# Patient Record
Sex: Male | Born: 1955 | Race: White | Hispanic: No | Marital: Married | Smoking: Never smoker
Health system: Southern US, Community
[De-identification: ages and names within clinical notes are randomized; demographics above are authoritative.]

## PROBLEM LIST (undated history)

## (undated) DIAGNOSIS — G4730 Sleep apnea, unspecified: Secondary | ICD-10-CM

## (undated) DIAGNOSIS — I10 Essential (primary) hypertension: Secondary | ICD-10-CM

## (undated) DIAGNOSIS — K219 Gastro-esophageal reflux disease without esophagitis: Secondary | ICD-10-CM

---

## 2002-04-28 ENCOUNTER — Encounter: Payer: Self-pay | Admitting: Neurosurgery

## 2002-05-01 ENCOUNTER — Encounter: Payer: Self-pay | Admitting: Neurosurgery

## 2002-05-01 ENCOUNTER — Ambulatory Visit (HOSPITAL_COMMUNITY): Admission: RE | Admit: 2002-05-01 | Discharge: 2002-05-02 | Payer: Self-pay | Admitting: Neurosurgery

## 2002-05-24 ENCOUNTER — Ambulatory Visit (HOSPITAL_COMMUNITY): Admission: RE | Admit: 2002-05-24 | Discharge: 2002-05-24 | Payer: Self-pay | Admitting: Neurosurgery

## 2002-05-24 ENCOUNTER — Encounter: Payer: Self-pay | Admitting: Neurosurgery

## 2002-08-12 ENCOUNTER — Inpatient Hospital Stay (HOSPITAL_COMMUNITY): Admission: RE | Admit: 2002-08-12 | Discharge: 2002-08-15 | Payer: Self-pay | Admitting: Neurosurgery

## 2002-08-12 ENCOUNTER — Encounter: Payer: Self-pay | Admitting: Neurosurgery

## 2002-09-16 ENCOUNTER — Ambulatory Visit (HOSPITAL_COMMUNITY): Admission: RE | Admit: 2002-09-16 | Discharge: 2002-09-16 | Payer: Self-pay | Admitting: Neurosurgery

## 2002-09-16 ENCOUNTER — Encounter: Payer: Self-pay | Admitting: Neurosurgery

## 2002-10-01 ENCOUNTER — Emergency Department (HOSPITAL_COMMUNITY): Admission: EM | Admit: 2002-10-01 | Discharge: 2002-10-01 | Payer: Self-pay | Admitting: *Deleted

## 2002-10-01 ENCOUNTER — Encounter: Payer: Self-pay | Admitting: Neurosurgery

## 2002-10-01 ENCOUNTER — Encounter: Payer: Self-pay | Admitting: Emergency Medicine

## 2002-10-01 ENCOUNTER — Encounter: Admission: RE | Admit: 2002-10-01 | Discharge: 2002-10-01 | Payer: Self-pay | Admitting: Neurosurgery

## 2005-05-30 ENCOUNTER — Encounter: Payer: Self-pay | Admitting: Neurosurgery

## 2006-09-17 ENCOUNTER — Encounter: Admission: RE | Admit: 2006-09-17 | Discharge: 2006-09-17 | Payer: Self-pay | Admitting: Neurosurgery

## 2006-10-17 ENCOUNTER — Encounter: Admission: RE | Admit: 2006-10-17 | Discharge: 2006-10-17 | Payer: Self-pay | Admitting: Neurosurgery

## 2008-03-05 IMAGING — CR DG PELVIS 1-2V
1 series · 1 of 1 positions shown · non-contrast
Comparison: none

CLINICAL DATA: Posterior right hip pain for a week.  No injury.
 RIGHT HIP ? 2 VIEW:

[t pelvis a.p.]
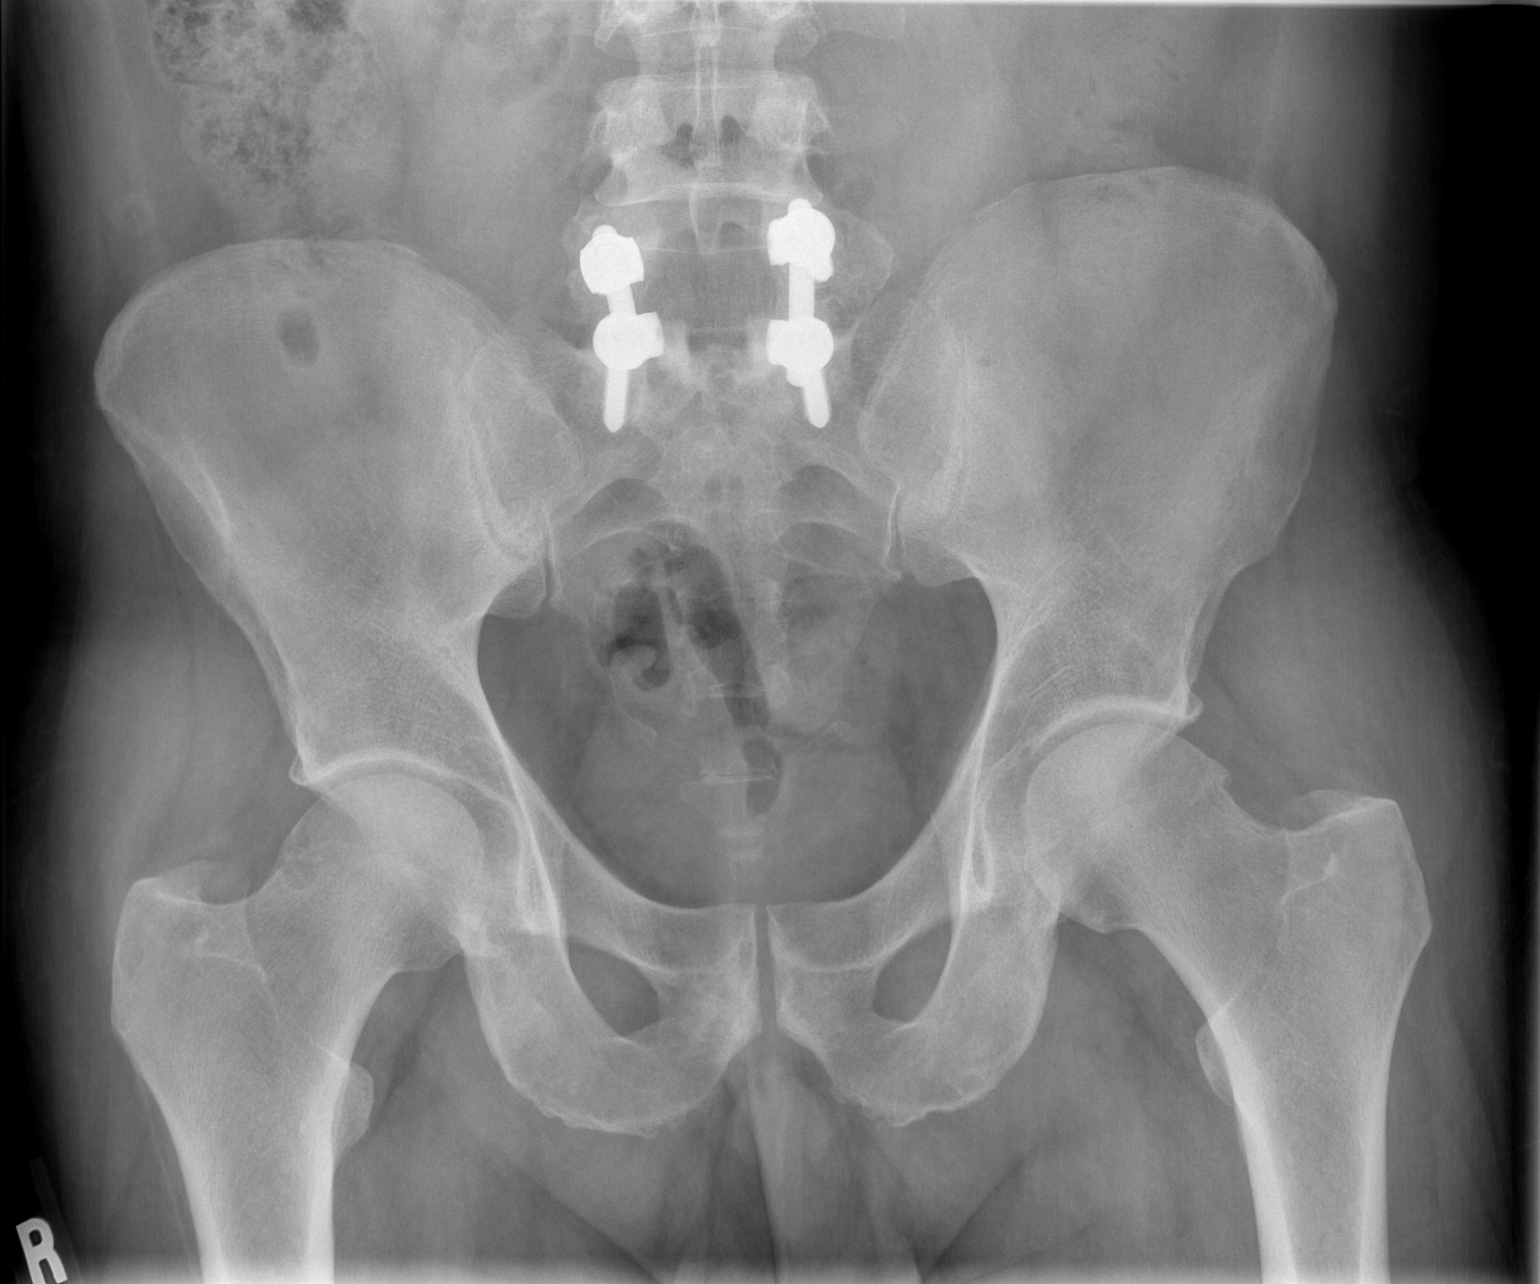

[1 of 1 positions shown; findings below may reference images not displayed]

FINDINGS: Two views right hip were obtained.  No acute fracture is seen.  Benign-appearing bony lesion is noted in the right femoral neck with well corticated margins, of doubtful significance.  Pelvic ramus is intact.
IMPRESSION: No acute abnormality.  Hip joint space is normal.
 PELVIS ? 1 VIEW:
FINDINGS: A single view of the pelvis shows both hip joint spaces to be normal.   The pelvic rami are intact.  The SI joints appear normal.  Posterior fusion is noted at L5-S1.
IMPRESSION: Negative pelvis.  Posterior fusion at L5-S1.

## 2010-06-16 NOTE — Op Note (Signed)
NAMECAIDENCE, HIGASHI NO.:  [id number]   MEDICAL RECORD NO.:  [id number]                   PATIENT TYPE:  OIB   LOCATION:  3011                                 FACILITY:  [facility]   PHYSICIAN:  Donalee Citrin, M.D.                     DATE OF BIRTH:  Jul 06, 1955   DATE OF PROCEDURE:  05/01/2002  DATE OF DISCHARGE:                                 OPERATIVE REPORT   PREOPERATIVE DIAGNOSIS:  Left S1 radiculopathy.   PROCEDURE:  Lumbar laminectomy with microdiskectomy at L5-S1 left, with  microscopic dissection of left S1 nerve root.   SURGEON:  Donalee Citrin, M.D.   ASSISTANT:  Reinaldo Meeker, M.D.   ANESTHESIA:  General endotracheal.   CLINICAL NOTE:  The patient is a very pleasant 55 year old gentleman who has  had longstanding back and leg pain radiating down his left leg  predominantly, to the outside of his foot and his little toe, with numbness  and tingling in the same distribution.  The patient has been refractory to  conservative treatment.  Preoperative imaging showed a large ruptured disk  at L5-S1 compressing the left S1 nerve root as well as the thecal sac.  The  patient did have some right leg symptoms, but the left leg predominated and  the disk was predominantly central and paracentral to the left.  The patient  was explained the risks and benefits of surgery, and he understands and  agreed to proceed forward.   DESCRIPTION OF PROCEDURE:  The patient was brought to the OR, was induced  under general anesthesia and positioned prone on the Wilson frame.  His back  was prepped and draped in the usual sterile fashion.  A preoperative x-ray  localized the S1-2 disk space, so the incision was made slightly superior to  this through the old scar.  Then Bovie electrocautery was used to take down  the subcutaneous tissue and subperiosteal dissection was carried out in the  lamina of L5 and S1 on the left.  Then an intraoperative x-ray confirmed  localization of the L5-S1 disk space.  Then using a 3 and 4 mm Kerrison  punch, the inferior aspect of the lamina of L5 and the superior aspect of  the lamina of S1 was removed from the medial aspect of the facet complex,  exposing the ligamentum flavum.  This was also removed in a piecemeal  fashion, exposing the thecal sac and the S1 and S2 nerve root.  Then the  operating microscope was draped and brought into the field and under  microscopic illumination, the S1 nerve root was dissected off a very large,  partially calcified and degenerated disk rupture and reflected medially with  the D'Errico nerve root retractor, and several large fragments of disk were  removed, teased out with a blunt nerve hook and pituitary rongeurs.  Then  the remainder of the  annulus was opened up with an 11 blade scalpel.  The  disk was cleaned out.  Several partially calcified pieces of disk in several  fragments were removed both centrally as well as cephalocaudally.  At the  end of the diskectomy, there was no further stenosis of the thecal sac or  the S1 nerve root.  The wound was copiously irrigated and meticulous  hemostasis was maintained.  Gelfoam was overlaid on top of the dura.  The  muscle and fascia were reapproximated with 0 interrupted Vicryl, the  subcutaneous tissue with 2-0  interrupted Vicryl, and the skin was closed with running 4-0 subcuticular.  Benzoin and Steri-Strips were applied.  The patient went to the recovery  room in stable condition.  At the end of the case all needle and sponge  counts were correct.                                               Donalee Citrin, M.D.    GC/MEDQ  D:  05/01/2002  T:  05/02/2002  Job:  [id number]

## 2010-06-16 NOTE — Op Note (Signed)
Chris Dodson, Chris Dodson                          ACCOUNT NO.:  [id number]   MEDICAL RECORD NO.:  [id number]                   PATIENT TYPE:  INP   LOCATION:  3172                                 FACILITY:  [facility]   PHYSICIAN:  Donalee Citrin, M.D.                     DATE OF BIRTH:  Dec 24, 1955   DATE OF PROCEDURE:  08/12/2002  DATE OF DISCHARGE:                                 OPERATIVE REPORT   PREOPERATIVE DIAGNOSIS:  Recurrent herniated nucleus pulposus L5-S1 with  left greater than right S1 radiculopathy, severe diskogenic mechanical low  back pain and diskogenic instability.   POSTOPERATIVE DIAGNOSIS:  Recurrent herniated nucleus pulposus L5-S1 with  left greater than right S1 radiculopathy, severe diskogenic mechanical low  back pain and diskogenic instability.   OPERATION PERFORMED:  Redo decompressive laminectomy L5-S1, posterior lumbar  interbody fusion, L5-S1 using 8 x 26 mm tangent allograft wedges, pedicle  screw fixation L5-S1 using the N10 pedicle screw system with 6.5 x 45  pedicle screws at L5 and 6.5 x 40 at S1.  Posterolateral arthrodesis using  locally harvested autograft L5-S1.   SURGEON:  Donalee Citrin, M.D.   ASSISTANT:  Kathaleen Maser. Pool, M.D.   ANESTHESIA:  General endotracheal.   INDICATIONS FOR PROCEDURE:  The patient is a very pleasant 55 year old  gentleman who has had longstanding back and leg pain.  He underwent  laminectomy and microdiskectomy several months ago and did very well for  several weeks postoperative and then experienced immediate recurrence of  severe back and leg pain.  He underwent conservative treatment with physical  therapy, epidural  steroid injections and had some relief of his leg pain;  however, the back pain got progressively worse and worse.  Preoperative  reimaging showed severe degenerative disk disease and collapse at L5-S1 with  end plate changes too, as well as recurrent herniated nucleus pulposus at L5-  S1 on the left side.  The  patient's symptomatology is predominantly back  greater than leg pain with pain going from lying to sitting and sitting to  standing position, worse with all types of mechanical motion and activity.  This was felt to be probably discogenic and patient was recommended  decompression and stabilization procedure.  I extensively went over the  risks and benefits of surgery with him and he understands and agreed to  proceed forward.   DESCRIPTION OF PROCEDURE:  The patient was brought to the operating room was  induced under general anesthesia and placed prone on the Wilson frame. The  back was prepped and draped in the usual sterile fashion.  The old incision  was opened up.  Bovie electrocautery was used to take down to subcutaneous  tissue and subperiosteal dissection was carried out to the lamina of L4, 5  and S1 bilaterally exposing the transverse processes of L5 and S1.  Intraoperative x-ray confirmed localization of the L5  pedicle.  A lot of  scar tissue was teased away from the L5-S1 laminectomy on the left side.  Then using high speed drill, medial aspect of the facet complex bilaterally  was drilled down. Then using Leksell rongeur and 3 and 4 mm Kerrison  punches, a radical decompressive laminectomy and medial facetectomy was  completed on the right side exposing both the right L5 and S1 nerve roots  __________  foramina. The disk space was then identified.  Epidural veins  were coagulated. There was noted to be large bulbous kind of diffuse disk  bulge at this level on the right. Then attention taken to the left side.  Scar tissue was freed up from the dura with a 4 Penfield and  dissectors  exposing the L5 and S1 nerve roots.  Medial facet complex was removed and  the scar tissue was teased away to free up the S1 nerve root from the  pedicle on the left side, then the interspace was identified.  There was a  large bulbous fragment contained in the ligament noted on the left side.   This was teased away and the S1 nerve root was reflected medially. Then  epidural veins coagulated.  Then using the high speed drill, pilot holes  were drilled at the L5 pedicle on the left.  This pedicle was cannulated,  probed, capped and probed again and a 6.5 x 45 pedicle screw was inserted at  L5 on the left.  Fluoroscopy confirmed good depth and trajectory __________  confirmed no medial breach and the pedicle was probed 360 orientation at  each step along the way and noted to be completely competent.  This  procedure was repeated at  S1 on the left and a 6.5 x 40 pedicle screw  inserted and this procedure was repeated on the right side with 6.5 x 45 at  L5 and 6.5 x 35 at S1.  Then D'Errico nerve root retractor was used to  reflect the right S1 nerve root medially. The disk space was incised and  found to be severely collapsed.  Using a size 7 distractor, this disk space  was opened up and on the left side, D'Errico nerve root retractor was used  to reflect the S1 nerve root medially.  The disk space was adequately  cleaned out and a distractor was inserted.  Then on the right side, again,  the disk space was reentered, cleaned out with down going Epstein curets,  pituitary rongeurs and 8 cutter and chisel were used to prepare the end  plates.  Again fluoroscopy confirmed the depth and trajectory at each step  along the way and 8 x 26 mm tangent allograft was inserted on the right  side.  The distractor was removed from the left side.  The disk space was  adequately cleaned out __________  fragments contained in the ligament as  well as centrally were scraped out using size 8 cutter and chisel to loosen  end plates were prepared to receive the bone graft and __________  packed  into the central interspace against the allograft on the right side and 8 x  26 mm Tangent allograft inserted on the left side.  __________  fluoroscopy confirmed the depth and trajectory approximately  __________  posterior  vertebral body line.  Attention was taken to aggressive decortication after  copious irrigation was achieved, then decortication on the transverse  processes and lateral gutters was achieved.  The remainder of the locally  harvested autograft  was packed into the lateral gutters along the transverse  processes and __________  complexes.  Then the rods were inserted and  tightened down at S1. The L5 pedicle screws were compressed again S1 and  torqued down.  Gelfoam was overlaid atop the dura. Fluoroscopy confirmed  good position of rod, screws and bone grafts.  Then the medium Hemovac drain  was placed.  Meticulous hemostasis was maintained.  The muscle and fascia  reapproximated with 0 interrupted Vicryl, subcutaneous tissue closed with 2-  0 interrupted Vicryl and the skin closed with two interrupted Vicryl and the  skin was closed with running 4-0 subcuticular with Benzoin and Steri-Strips  applied.  The patient was then transferred to the recovery room in stable  condition.  At the end of the case all sponge and needle counts were  correct.                                                Donalee Citrin, M.D.    GC/MEDQ  D:  08/12/2002  T:  08/12/2002  Job:  [id number]

## 2010-06-16 NOTE — Discharge Summary (Signed)
   Chris Dodson, Chris Dodson NO.:  [id number]   MEDICAL RECORD NO.:  [id number]                   PATIENT TYPE:  INP   LOCATION:  3030                                 FACILITY:  [facility]   PHYSICIAN:  Donalee Citrin, M.D.                     DATE OF BIRTH:  [birth year]   DATE OF ADMISSION:  08/12/2002  DATE OF DISCHARGE:  08/15/2002                                 DISCHARGE SUMMARY   ADMISSION DIAGNOSIS:  Severe degenerative disk disease at L5-S1 with  ruptured disk and S1 radiculopathy.   PROCEDURE:  The patient underwent a decompressive laminectomy re-do at L5-S1  with posterior interbody fusion.   I discussed the risks of surgery with him and he understands and agreed to  proceed.   HOSPITAL COURSE:  The patient was admitted and went to the operating room  and underwent the aforementioned procedure.  Postoperatively he did very  well recovering on the floor.  He was afebrile with stable vital signs.  He  was ambulating and voiding.  Subsequently, by day two the Hemovac drain had  put out minimal amounts and this was discontinued.  The patient was  progressively mobilized with physical therapy.  He was running a low grade  temperature of 100.  This immediately defervesced and by hospital day three  the patient was stable for discharge home.   MEDICATIONS:  He was discharged home on pain medication and muscle relaxer.   FOLLOW UP:  He is scheduled to follow-up in two weeks.                                                Donalee Citrin, M.D.    GC/MEDQ  D:  09/10/2002  T:  09/10/2002  Job:  [id number]

## 2020-05-12 ENCOUNTER — Other Ambulatory Visit: Payer: Self-pay | Admitting: Neurosurgery

## 2020-05-24 NOTE — Progress Notes (Signed)
Surgical Instructions    Your procedure is scheduled on Friday, April 29th, 2022.  Report to [facility] Main Entrance "A" at 12:40 A.M., then check in with the Admitting office.  Call this number if you have problems the morning of surgery:  [phone]   If you have any questions prior to your surgery date call [phone]: Open Monday-Friday 8am-4pm    Remember:  Do not eat or drink after midnight the night before your surgery   Take these medicines the morning of surgery with A SIP OF WATER   amLODipine (NORVASC)  atorvastatin (LIPITOR) Fenofibrate FLUoxetine (PROZAC) gabapentin (NEURONTIN) HYDROcodone-acetaminophen (NORCO) metoprolol tartrate (LOPRESSOR) pantoprazole (PROTONIX)  SYNTHROID celecoxib (CELEBREX)   If needed  traMADol (ULTRAM)   . Do not take  metFORMIN (GLUCOPHAGE)   the morning of surgery.   HOW TO MANAGE YOUR DIABETES BEFORE AND AFTER SURGERY  Why is it important to control my blood sugar before and after surgery? . Improving blood sugar levels before and after surgery helps healing and can limit problems. . A way of improving blood sugar control is eating a healthy diet by: o  Eating less sugar and carbohydrates o  Increasing activity/exercise o  Talking with your doctor about reaching your blood sugar goals . High blood sugars (greater than 180 mg/dL) can raise your risk of infections and slow your recovery, so you will need to focus on controlling your diabetes during the weeks before surgery. . Make sure that the doctor who takes care of your diabetes knows about your planned surgery including the date and location.  How do I manage my blood sugar before surgery? . Check your blood sugar at least 4 times a day, starting 2 days before surgery, to make sure that the level is not too high or low. . Check your blood sugar the morning of your surgery when you wake up and every 2 hours until you get to the Short Stay unit. o If your blood sugar is  less than 70 mg/dL, you will need to treat for low blood sugar: - Do not take insulin. - Treat a low blood sugar (less than 70 mg/dL) with  cup of clear juice (cranberry or apple), 4 glucose tablets, OR glucose gel. - Recheck blood sugar in 15 minutes after treatment (to make sure it is greater than 70 mg/dL). If your blood sugar is not greater than 70 mg/dL on recheck, call [phone] for further instructions. . Report your blood sugar to the short stay nurse when you get to Short Stay.  . If you are admitted to the hospital after surgery: o Your blood sugar will be checked by the staff and you will probably be given insulin after surgery (instead of oral diabetes medicines) to make sure you have good blood sugar levels. o The goal for blood sugar control after surgery is 80-180 mg/dL.   As of today, STOP taking any Aspirin (unless otherwise instructed by your surgeon) Aleve, Naproxen, Ibuprofen, Motrin, Advil, Goody's, BC's, all herbal medications, fish oil, and all vitamins.                     Do not wear jewelry,             Do not wear lotions, powders, colognes, or deodorant.            Men may shave face and neck.            Do not bring valuables to the  hospital.            [facility] is not responsible for any belongings or valuables.  Do NOT Smoke (Tobacco/Vaping) or drink Alcohol 24 hours prior to your procedure If you use a CPAP at night, you may bring all equipment for your overnight stay.   Contacts, glasses, dentures or bridgework may not be worn into surgery, please bring cases for these belongings   For patients admitted to the hospital, discharge time will be determined by your treatment team.   Patients discharged the day of surgery will not be allowed to drive home, and someone needs to stay with them for 24 hours.    Special instructions:   [place]- Preparing For Surgery  Before surgery, you can play an important role. Because skin is not sterile, your  skin needs to be as free of germs as possible. You can reduce the number of germs on your skin by washing with CHG (chlorahexidine gluconate) Soap before surgery.  CHG is an antiseptic cleaner which kills germs and bonds with the skin to continue killing germs even after washing.    Oral Hygiene is also important to reduce your risk of infection.  Remember - BRUSH YOUR TEETH THE MORNING OF SURGERY WITH YOUR REGULAR TOOTHPASTE  Please do not use if you have an allergy to CHG or antibacterial soaps. If your skin becomes reddened/irritated stop using the CHG.  Do not shave (including legs and underarms) for at least 48 hours prior to first CHG shower. It is OK to shave your face.  Please follow these instructions carefully.   1. Shower the NIGHT BEFORE SURGERY and the MORNING OF SURGERY  2. If you chose to wash your hair, wash your hair first as usual with your normal shampoo.  3. After you shampoo, rinse your hair and body thoroughly to remove the shampoo.  4. Wash Face and genitals (private parts) with your normal soap.   5.  Shower the NIGHT BEFORE SURGERY and the MORNING OF SURGERY with CHG Soap.   6. Use CHG Soap as you would any other liquid soap. You can apply CHG directly to the skin and wash gently with a scrungie or a clean washcloth.   7. Apply the CHG Soap to your body ONLY FROM THE NECK DOWN.  Do not use on open wounds or open sores. Avoid contact with your eyes, ears, mouth and genitals (private parts). Wash Face and genitals (private parts)  with your normal soap.   8. Wash thoroughly, paying special attention to the area where your surgery will be performed.  9. Thoroughly rinse your body with warm water from the neck down.  10. DO NOT shower/wash with your normal soap after using and rinsing off the CHG Soap.  11. Pat yourself dry with a CLEAN TOWEL.  12. Wear CLEAN PAJAMAS to bed the night before surgery  13. Place CLEAN SHEETS on your bed the night before your  surgery  14. DO NOT SLEEP WITH PETS.   Day of Surgery: Take a shower with CHG soap. Wear Clean/Comfortable clothing the morning of surgery Do not apply any deodorants/lotions.   Remember to brush your teeth WITH YOUR REGULAR TOOTHPASTE.   Please read over the following fact sheets that you were given.

## 2020-05-25 ENCOUNTER — Other Ambulatory Visit: Payer: Self-pay

## 2020-05-25 ENCOUNTER — Encounter (HOSPITAL_COMMUNITY): Payer: Self-pay

## 2020-05-25 ENCOUNTER — Encounter (HOSPITAL_COMMUNITY)
Admission: RE | Admit: 2020-05-25 | Discharge: 2020-05-25 | Disposition: A | Discharge status: Home / Self Care | Payer: Medicare (Managed Care) | Source: Ambulatory Visit | Attending: Neurosurgery | Admitting: Neurosurgery

## 2020-05-25 DIAGNOSIS — Z01818 Encounter for other preprocedural examination: Secondary | ICD-10-CM | POA: Diagnosis present

## 2020-05-25 DIAGNOSIS — Z20822 Contact with and (suspected) exposure to covid-19: Secondary | ICD-10-CM | POA: Insufficient documentation

## 2020-05-25 HISTORY — DX: Gastro-esophageal reflux disease without esophagitis: K21.9

## 2020-05-25 HISTORY — DX: Sleep apnea, unspecified: G47.30

## 2020-05-25 HISTORY — DX: Essential (primary) hypertension: I10

## 2020-05-25 LAB — BASIC METABOLIC PANEL
Anion gap: 8 (ref 5–15)
BUN: 10 mg/dL (ref 8–23)
CO2: 31 mmol/L (ref 22–32)
Calcium: 9.3 mg/dL (ref 8.9–10.3)
Chloride: 103 mmol/L (ref 98–111)
Creatinine, Ser: 1.09 mg/dL (ref 0.61–1.24)
GFR, Estimated: >60 mL/min (ref >60)
Glucose, Bld: 87 mg/dL (ref 70–99)
Potassium: 4.1 mmol/L (ref 3.5–5.1)
Sodium: 142 mmol/L (ref 135–145)

## 2020-05-25 LAB — TYPE AND SCREEN
ABO/RH(D): A POS
Antibody Screen: NEGATIVE

## 2020-05-25 LAB — SURGICAL PCR SCREEN
MRSA, PCR: NEGATIVE
Staphylococcus aureus: NEGATIVE

## 2020-05-25 LAB — GLUCOSE, CAPILLARY: Glucose-Capillary: 107 mg/dL — ABNORMAL HIGH (ref 70–99)

## 2020-05-25 NOTE — Progress Notes (Signed)
PCP - Carola Frost, NP; Hortense Ramal, MD Cardiologist - Denies  PPM/ICD - Denies  Chest x-ray - N/A EKG - 05/25/20 Stress Test - Denies ECHO - Denies Cardiac Cath - Denies  Sleep Study - Yes, positive for OSA, pt does not wear CPAP  Fasting Blood Sugar: Pt does not check CBGs. BCG at PAT appointment was 107. A1C obtained.  Blood Thinner Instructions: N/A Aspirin Instructions: N/A  ERAS Protcol - N/A PRE-SURGERY Ensure or G2- N/A  COVID TEST- 05/25/20   Anesthesia review: No  Patient denies shortness of breath, fever, cough and chest pain at PAT appointment   All instructions explained to the patient, with a verbal understanding of the material. Patient agrees to go over the instructions while at home for a better understanding. Patient also instructed to self quarantine after being tested for COVID-19. The opportunity to ask questions was provided.

## 2020-05-25 NOTE — Progress Notes (Addendum)
Surgical Instructions    Your procedure is scheduled on Friday, April 29th, 2022.  Report to [facility] Main Entrance "A" at 12:40 A.M., then check in with the Admitting office.  Call this number if you have problems the morning of surgery:  [phone]   If you have any questions prior to your surgery date call [phone]: Open Monday-Friday 8am-4pm    Remember:  Do not eat or drink after midnight the night before your surgery   Take these medicines the morning of surgery with A SIP OF WATER   amLODipine (NORVASC)  atorvastatin (LIPITOR) Fenofibrate FLUoxetine (PROZAC) gabapentin (NEURONTIN) HYDROcodone-acetaminophen (NORCO) metoprolol tartrate (LOPRESSOR) pantoprazole (PROTONIX)  SYNTHROID  If needed  traMADol (ULTRAM)   . Do not take  metFORMIN (GLUCOPHAGE)   the morning of surgery.   HOW TO MANAGE YOUR DIABETES BEFORE AND AFTER SURGERY  Why is it important to control my blood sugar before and after surgery? . Improving blood sugar levels before and after surgery helps healing and can limit problems. . A way of improving blood sugar control is eating a healthy diet by: o  Eating less sugar and carbohydrates o  Increasing activity/exercise o  Talking with your doctor about reaching your blood sugar goals . High blood sugars (greater than 180 mg/dL) can raise your risk of infections and slow your recovery, so you will need to focus on controlling your diabetes during the weeks before surgery. . Make sure that the doctor who takes care of your diabetes knows about your planned surgery including the date and location.  How do I manage my blood sugar before surgery? . Check your blood sugar at least 4 times a day, starting 2 days before surgery, to make sure that the level is not too high or low. . Check your blood sugar the morning of your surgery when you wake up and every 2 hours until you get to the Short Stay unit. o If your blood sugar is less than 70 mg/dL,  you will need to treat for low blood sugar: - Do not take insulin. - Treat a low blood sugar (less than 70 mg/dL) with  cup of clear juice (cranberry or apple), 4 glucose tablets, OR glucose gel. - Recheck blood sugar in 15 minutes after treatment (to make sure it is greater than 70 mg/dL). If your blood sugar is not greater than 70 mg/dL on recheck, call [phone] for further instructions. . Report your blood sugar to the short stay nurse when you get to Short Stay.  . If you are admitted to the hospital after surgery: o Your blood sugar will be checked by the staff and you will probably be given insulin after surgery (instead of oral diabetes medicines) to make sure you have good blood sugar levels. o The goal for blood sugar control after surgery is 80-180 mg/dL.   As of today, STOP taking any Aspirin (unless otherwise instructed by your surgeon) Aleve, Naproxen, Ibuprofen, Motrin, Advil, Goody's, BC's, all herbal medications, fish oil, and all vitamins.                     Do not wear jewelry,             Do not wear lotions, powders, colognes, or deodorant.            Men may shave face and neck.            Do not bring valuables to the hospital.  [facility] is not responsible for any belongings or valuables.  Do NOT Smoke (Tobacco/Vaping) or drink Alcohol 24 hours prior to your procedure If you use a CPAP at night, you may bring all equipment for your overnight stay.   Contacts, glasses, dentures or bridgework may not be worn into surgery, please bring cases for these belongings   For patients admitted to the hospital, discharge time will be determined by your treatment team.   Patients discharged the day of surgery will not be allowed to drive home, and someone needs to stay with them for 24 hours.    Special instructions:   [facility]- Preparing For Surgery  Before surgery, you can play an important role. Because skin is not sterile, your skin needs to be as  free of germs as possible. You can reduce the number of germs on your skin by washing with CHG (chlorahexidine gluconate) Soap before surgery.  CHG is an antiseptic cleaner which kills germs and bonds with the skin to continue killing germs even after washing.    Oral Hygiene is also important to reduce your risk of infection.  Remember - BRUSH YOUR TEETH THE MORNING OF SURGERY WITH YOUR REGULAR TOOTHPASTE  Please do not use if you have an allergy to CHG or antibacterial soaps. If your skin becomes reddened/irritated stop using the CHG.  Do not shave (including legs and underarms) for at least 48 hours prior to first CHG shower. It is OK to shave your face.  Please follow these instructions carefully.   1. Shower the NIGHT BEFORE SURGERY and the MORNING OF SURGERY  2. If you chose to wash your hair, wash your hair first as usual with your normal shampoo.  3. After you shampoo, rinse your hair and body thoroughly to remove the shampoo.  4. Wash Face and genitals (private parts) with your normal soap.   5.  Shower the NIGHT BEFORE SURGERY and the MORNING OF SURGERY with CHG Soap.   6. Use CHG Soap as you would any other liquid soap. You can apply CHG directly to the skin and wash gently with a scrungie or a clean washcloth.   7. Apply the CHG Soap to your body ONLY FROM THE NECK DOWN.  Do not use on open wounds or open sores. Avoid contact with your eyes, ears, mouth and genitals (private parts). Wash Face and genitals (private parts)  with your normal soap.   8. Wash thoroughly, paying special attention to the area where your surgery will be performed.  9. Thoroughly rinse your body with warm water from the neck down.  10. DO NOT shower/wash with your normal soap after using and rinsing off the CHG Soap.  11. Pat yourself dry with a CLEAN TOWEL.  12. Wear CLEAN PAJAMAS to bed the night before surgery  13. Place CLEAN SHEETS on your bed the night before your surgery  14. DO NOT SLEEP  WITH PETS.   Day of Surgery: Take a shower with CHG soap. Wear Clean/Comfortable clothing the morning of surgery Do not apply any deodorants/lotions.   Remember to brush your teeth WITH YOUR REGULAR TOOTHPASTE.   Please read over the following fact sheets that you were given.

## 2020-05-25 NOTE — Progress Notes (Signed)
Lab called stating there was an error in the lab with the purple top tube. Will need to redraw CBC and HgbA1C on DOS. Orders in signed and held.

## 2020-05-26 LAB — SARS CORONAVIRUS 2 (TAT 6-24 HRS): SARS Coronavirus 2: NEGATIVE

## 2020-05-27 ENCOUNTER — Ambulatory Visit (HOSPITAL_COMMUNITY): Payer: Medicare (Managed Care)

## 2020-05-27 ENCOUNTER — Other Ambulatory Visit: Payer: Self-pay

## 2020-05-27 ENCOUNTER — Encounter (HOSPITAL_COMMUNITY)
Admission: RE | Disposition: A | Discharge status: Home / Self Care | Payer: Self-pay | Source: Ambulatory Visit | Attending: Neurosurgery

## 2020-05-27 ENCOUNTER — Ambulatory Visit (HOSPITAL_COMMUNITY)
Admission: RE | Admit: 2020-05-27 | Discharge: 2020-05-28 | Disposition: A | Discharge status: Home / Self Care | Payer: Medicare (Managed Care) | Source: Ambulatory Visit | Attending: Neurosurgery | Admitting: Neurosurgery

## 2020-05-27 ENCOUNTER — Ambulatory Visit (HOSPITAL_COMMUNITY): Payer: Medicare (Managed Care) | Admitting: Certified Registered Nurse Anesthetist

## 2020-05-27 ENCOUNTER — Encounter (HOSPITAL_COMMUNITY): Payer: Self-pay | Admitting: Neurosurgery

## 2020-05-27 DIAGNOSIS — Z791 Long term (current) use of non-steroidal anti-inflammatories (NSAID): Secondary | ICD-10-CM | POA: Diagnosis not present

## 2020-05-27 DIAGNOSIS — M48061 Spinal stenosis, lumbar region without neurogenic claudication: Secondary | ICD-10-CM | POA: Insufficient documentation

## 2020-05-27 DIAGNOSIS — M5116 Intervertebral disc disorders with radiculopathy, lumbar region: Secondary | ICD-10-CM | POA: Insufficient documentation

## 2020-05-27 DIAGNOSIS — Z7984 Long term (current) use of oral hypoglycemic drugs: Secondary | ICD-10-CM | POA: Diagnosis not present

## 2020-05-27 DIAGNOSIS — Z981 Arthrodesis status: Secondary | ICD-10-CM | POA: Diagnosis not present

## 2020-05-27 DIAGNOSIS — Z79899 Other long term (current) drug therapy: Secondary | ICD-10-CM | POA: Diagnosis not present

## 2020-05-27 DIAGNOSIS — Z79890 Hormone replacement therapy (postmenopausal): Secondary | ICD-10-CM | POA: Diagnosis not present

## 2020-05-27 DIAGNOSIS — Z419 Encounter for procedure for purposes other than remedying health state, unspecified: Secondary | ICD-10-CM

## 2020-05-27 LAB — CBC
HCT: 40.6 % (ref 39.0–52.0)
Hemoglobin: 14.5 g/dL (ref 13.0–17.0)
MCH: 31.3 pg (ref 26.0–34.0)
MCHC: 35.7 g/dL (ref 30.0–36.0)
MCV: 87.7 fL (ref 80.0–100.0)
Platelets: 187 10*3/uL (ref 150–400)
RBC: 4.63 MIL/uL (ref 4.22–5.81)
RDW: 13 % (ref 11.5–15.5)
WBC: 7.2 10*3/uL (ref 4.0–10.5)
nRBC: 0 % (ref 0.0–0.2)

## 2020-05-27 LAB — ABO/RH: ABO/RH(D): A POS

## 2020-05-27 LAB — GLUCOSE, CAPILLARY
Glucose-Capillary: 113 mg/dL — ABNORMAL HIGH (ref 70–99)
Glucose-Capillary: 121 mg/dL — ABNORMAL HIGH (ref 70–99)

## 2020-05-27 SURGERY — POSTERIOR LUMBAR FUSION 1 LEVEL
Anesthesia: General | Site: Back

## 2020-05-27 MED ORDER — GLYCOPYRROLATE PF 0.2 MG/ML IJ SOSY
PREFILLED_SYRINGE | INTRAMUSCULAR | Status: AC
  Filled 2020-05-27: qty 1

## 2020-05-27 MED ORDER — ATORVASTATIN CALCIUM 10 MG PO TABS
20.0000 mg | ORAL_TABLET | Freq: Every day | ORAL | Status: DC
  Administered 2020-05-28: 20 mg via ORAL
  Filled 2020-05-27: qty 2

## 2020-05-27 MED ORDER — TRAZODONE HCL 50 MG PO TABS
50.0000 mg | ORAL_TABLET | Freq: Every day | ORAL | Status: DC
  Administered 2020-05-27: 50 mg via ORAL
  Filled 2020-05-27: qty 1

## 2020-05-27 MED ORDER — OXYCODONE HCL 5 MG PO TABS
10.0000 mg | ORAL_TABLET | ORAL | Status: DC | PRN
Start: 2020-05-27 — End: 2020-05-28
  Administered 2020-05-27 – 2020-05-28 (×4): 10 mg via ORAL
  Filled 2020-05-27 (×3): qty 2

## 2020-05-27 MED ORDER — DEXAMETHASONE SODIUM PHOSPHATE 10 MG/ML IJ SOLN
10.0000 mg | Freq: Once | INTRAMUSCULAR | Status: AC
  Administered 2020-05-27: 10 mg via INTRAVENOUS
  Filled 2020-05-27: qty 1

## 2020-05-27 MED ORDER — THROMBIN 20000 UNITS EX SOLR
CUTANEOUS | Status: DC | PRN
  Administered 2020-05-27: 20 mL via TOPICAL

## 2020-05-27 MED ORDER — CEFAZOLIN SODIUM-DEXTROSE 2-4 GM/100ML-% IV SOLN
2.0000 g | Freq: Three times a day (TID) | INTRAVENOUS | Status: DC
  Administered 2020-05-27 – 2020-05-28 (×2): 2 g via INTRAVENOUS
  Filled 2020-05-27 (×2): qty 100

## 2020-05-27 MED ORDER — ONDANSETRON HCL 4 MG/2ML IJ SOLN
4.0000 mg | Freq: Once | INTRAMUSCULAR | Status: DC | PRN
Start: 2020-05-27 — End: 2020-05-27

## 2020-05-27 MED ORDER — CELECOXIB 100 MG PO CAPS
100.0000 mg | ORAL_CAPSULE | Freq: Two times a day (BID) | ORAL | Status: DC
  Administered 2020-05-27 – 2020-05-28 (×2): 100 mg via ORAL
  Filled 2020-05-27 (×2): qty 1

## 2020-05-27 MED ORDER — THROMBIN 20000 UNITS EX SOLR
CUTANEOUS | Status: AC
  Filled 2020-05-27: qty 20000

## 2020-05-27 MED ORDER — PANTOPRAZOLE SODIUM 40 MG PO TBEC: 40.0000 mg | DELAYED_RELEASE_TABLET | Freq: Every day | ORAL | Status: DC

## 2020-05-27 MED ORDER — DEXAMETHASONE SODIUM PHOSPHATE 10 MG/ML IJ SOLN
INTRAMUSCULAR | Status: AC
  Filled 2020-05-27: qty 1

## 2020-05-27 MED ORDER — PANTOPRAZOLE SODIUM 40 MG PO TBEC
40.0000 mg | DELAYED_RELEASE_TABLET | Freq: Every day | ORAL | Status: DC
  Administered 2020-05-27: 40 mg via ORAL
  Filled 2020-05-27: qty 1

## 2020-05-27 MED ORDER — CYCLOBENZAPRINE HCL 10 MG PO TABS
ORAL_TABLET | ORAL | Status: AC
  Filled 2020-05-27: qty 1

## 2020-05-27 MED ORDER — ACETAMINOPHEN 325 MG PO TABS
650.0000 mg | ORAL_TABLET | ORAL | Status: DC | PRN
  Administered 2020-05-27: 650 mg via ORAL
  Filled 2020-05-27 (×2): qty 2

## 2020-05-27 MED ORDER — HYDROMORPHONE HCL 1 MG/ML IJ SOLN
INTRAMUSCULAR | Status: AC
  Administered 2020-05-27: 0.5 mg via INTRAVENOUS
  Filled 2020-05-27: qty 1

## 2020-05-27 MED ORDER — ALBUMIN HUMAN 5 % IV SOLN: INTRAVENOUS | Status: DC | PRN

## 2020-05-27 MED ORDER — ROCURONIUM BROMIDE 10 MG/ML (PF) SYRINGE
PREFILLED_SYRINGE | INTRAVENOUS | Status: DC | PRN
  Administered 2020-05-27: 20 mg via INTRAVENOUS
  Administered 2020-05-27: 70 mg via INTRAVENOUS

## 2020-05-27 MED ORDER — PROPOFOL 10 MG/ML IV BOLUS
INTRAVENOUS | Status: DC | PRN
  Administered 2020-05-27: 200 mg via INTRAVENOUS

## 2020-05-27 MED ORDER — FENTANYL CITRATE (PF) 100 MCG/2ML IJ SOLN
INTRAMUSCULAR | Status: DC | PRN
  Administered 2020-05-27: 25 ug via INTRAVENOUS
  Administered 2020-05-27: 50 ug via INTRAVENOUS
  Administered 2020-05-27: 100 ug via INTRAVENOUS
  Administered 2020-05-27: 25 ug via INTRAVENOUS
  Administered 2020-05-27: 50 ug via INTRAVENOUS

## 2020-05-27 MED ORDER — FENTANYL CITRATE (PF) 250 MCG/5ML IJ SOLN
INTRAMUSCULAR | Status: AC
  Filled 2020-05-27: qty 5

## 2020-05-27 MED ORDER — FENOFIBRATE 160 MG PO TABS
160.0000 mg | ORAL_TABLET | Freq: Every day | ORAL | Status: DC
  Administered 2020-05-28: 160 mg via ORAL
  Filled 2020-05-27: qty 1

## 2020-05-27 MED ORDER — SODIUM CHLORIDE 0.9% FLUSH: 3.0000 mL | Freq: Two times a day (BID) | INTRAVENOUS | Status: DC

## 2020-05-27 MED ORDER — ACETAMINOPHEN 10 MG/ML IV SOLN: 1000.0000 mg | Freq: Once | INTRAVENOUS | Status: DC | PRN

## 2020-05-27 MED ORDER — ONDANSETRON HCL 4 MG/2ML IJ SOLN
INTRAMUSCULAR | Status: AC
  Filled 2020-05-27: qty 2

## 2020-05-27 MED ORDER — TAMSULOSIN HCL 0.4 MG PO CAPS
0.4000 mg | ORAL_CAPSULE | Freq: Every day | ORAL | Status: DC
  Administered 2020-05-28: 0.4 mg via ORAL
  Filled 2020-05-27: qty 1

## 2020-05-27 MED ORDER — MENTHOL 3 MG MT LOZG: 1.0000 | LOZENGE | OROMUCOSAL | Status: DC | PRN

## 2020-05-27 MED ORDER — CHLORHEXIDINE GLUCONATE 0.12 % MT SOLN
15.0000 mL | Freq: Once | OROMUCOSAL | Status: AC
  Administered 2020-05-27: 15 mL via OROMUCOSAL
  Filled 2020-05-27: qty 15

## 2020-05-27 MED ORDER — ONDANSETRON HCL 4 MG PO TABS: 4.0000 mg | ORAL_TABLET | Freq: Four times a day (QID) | ORAL | Status: DC | PRN

## 2020-05-27 MED ORDER — PROPOFOL 10 MG/ML IV BOLUS
INTRAVENOUS | Status: AC
  Filled 2020-05-27: qty 20

## 2020-05-27 MED ORDER — ONDANSETRON HCL 4 MG/2ML IJ SOLN: 4.0000 mg | Freq: Four times a day (QID) | INTRAMUSCULAR | Status: DC | PRN

## 2020-05-27 MED ORDER — CYCLOBENZAPRINE HCL 10 MG PO TABS
10.0000 mg | ORAL_TABLET | Freq: Three times a day (TID) | ORAL | Status: DC | PRN
  Administered 2020-05-27 – 2020-05-28 (×2): 10 mg via ORAL
  Filled 2020-05-27: qty 1

## 2020-05-27 MED ORDER — ORAL CARE MOUTH RINSE: 15.0000 mL | Freq: Once | OROMUCOSAL | Status: AC

## 2020-05-27 MED ORDER — CHLORHEXIDINE GLUCONATE CLOTH 2 % EX PADS: 6.0000 | MEDICATED_PAD | Freq: Once | CUTANEOUS | Status: DC

## 2020-05-27 MED ORDER — TAMSULOSIN HCL 0.4 MG PO CAPS
0.8000 mg | ORAL_CAPSULE | Freq: Once | ORAL | Status: AC
  Administered 2020-05-27: 0.8 mg via ORAL
  Filled 2020-05-27: qty 2

## 2020-05-27 MED ORDER — SODIUM CHLORIDE 0.9% FLUSH: 3.0000 mL | INTRAVENOUS | Status: DC | PRN

## 2020-05-27 MED ORDER — OXYCODONE HCL 5 MG PO TABS
ORAL_TABLET | ORAL | Status: AC
  Filled 2020-05-27: qty 2

## 2020-05-27 MED ORDER — THROMBIN 5000 UNITS EX SOLR
CUTANEOUS | Status: AC
  Filled 2020-05-27: qty 5000

## 2020-05-27 MED ORDER — GLYCOPYRROLATE 0.2 MG/ML IJ SOLN
INTRAMUSCULAR | Status: DC | PRN
  Administered 2020-05-27 (×2): .1 mg via INTRAVENOUS

## 2020-05-27 MED ORDER — EPHEDRINE SULFATE 50 MG/ML IJ SOLN
INTRAMUSCULAR | Status: DC | PRN
  Administered 2020-05-27 (×4): 5 mg via INTRAVENOUS

## 2020-05-27 MED ORDER — ADULT MULTIVITAMIN W/MINERALS CH
1.0000 | ORAL_TABLET | Freq: Every day | ORAL | Status: DC
  Administered 2020-05-28: 1 via ORAL
  Filled 2020-05-27: qty 1

## 2020-05-27 MED ORDER — PANTOPRAZOLE SODIUM 40 MG IV SOLR: 40.0000 mg | Freq: Every day | INTRAVENOUS | Status: DC

## 2020-05-27 MED ORDER — METFORMIN HCL 500 MG PO TABS
500.0000 mg | ORAL_TABLET | Freq: Two times a day (BID) | ORAL | Status: DC
  Administered 2020-05-28: 500 mg via ORAL
  Filled 2020-05-27: qty 1

## 2020-05-27 MED ORDER — MIDAZOLAM HCL 5 MG/5ML IJ SOLN
INTRAMUSCULAR | Status: DC | PRN
  Administered 2020-05-27: 2 mg via INTRAVENOUS

## 2020-05-27 MED ORDER — HYDROMORPHONE HCL 1 MG/ML IJ SOLN
0.5000 mg | INTRAMUSCULAR | Status: DC | PRN
Start: 2020-05-27 — End: 2020-05-28

## 2020-05-27 MED ORDER — SODIUM CHLORIDE 0.9 % IV SOLN: 250.0000 mL | INTRAVENOUS | Status: DC

## 2020-05-27 MED ORDER — FLUOXETINE HCL 20 MG PO CAPS
40.0000 mg | ORAL_CAPSULE | Freq: Every day | ORAL | Status: DC
  Administered 2020-05-28: 40 mg via ORAL
  Filled 2020-05-27: qty 2

## 2020-05-27 MED ORDER — LIDOCAINE 2% (20 MG/ML) 5 ML SYRINGE
INTRAMUSCULAR | Status: DC | PRN
  Administered 2020-05-27: 100 mg via INTRAVENOUS

## 2020-05-27 MED ORDER — MIDAZOLAM HCL 2 MG/2ML IJ SOLN
INTRAMUSCULAR | Status: AC
  Filled 2020-05-27: qty 2

## 2020-05-27 MED ORDER — SUGAMMADEX SODIUM 200 MG/2ML IV SOLN
INTRAVENOUS | Status: DC | PRN
  Administered 2020-05-27: 200 mg via INTRAVENOUS

## 2020-05-27 MED ORDER — LACTATED RINGERS IV SOLN: INTRAVENOUS | Status: DC

## 2020-05-27 MED ORDER — LIDOCAINE 2% (20 MG/ML) 5 ML SYRINGE
INTRAMUSCULAR | Status: AC
  Filled 2020-05-27: qty 5

## 2020-05-27 MED ORDER — 0.9 % SODIUM CHLORIDE (POUR BTL) OPTIME
TOPICAL | Status: DC | PRN
  Administered 2020-05-27: 1000 mL

## 2020-05-27 MED ORDER — GABAPENTIN 300 MG PO CAPS
300.0000 mg | ORAL_CAPSULE | Freq: Three times a day (TID) | ORAL | Status: DC
  Administered 2020-05-27 – 2020-05-28 (×2): 300 mg via ORAL
  Filled 2020-05-27 (×2): qty 1

## 2020-05-27 MED ORDER — LOSARTAN POTASSIUM 50 MG PO TABS
50.0000 mg | ORAL_TABLET | Freq: Every day | ORAL | Status: DC
  Administered 2020-05-28: 50 mg via ORAL
  Filled 2020-05-27: qty 1

## 2020-05-27 MED ORDER — PHENYLEPHRINE HCL (PRESSORS) 10 MG/ML IV SOLN
INTRAVENOUS | Status: DC | PRN
  Administered 2020-05-27 (×2): 40 ug via INTRAVENOUS

## 2020-05-27 MED ORDER — BUPIVACAINE LIPOSOME 1.3 % IJ SUSP
INTRAMUSCULAR | Status: DC | PRN
  Administered 2020-05-27: 20 mL

## 2020-05-27 MED ORDER — LIDOCAINE-EPINEPHRINE 1 %-1:100000 IJ SOLN
INTRAMUSCULAR | Status: AC
  Filled 2020-05-27: qty 1

## 2020-05-27 MED ORDER — METOPROLOL TARTRATE 25 MG PO TABS
50.0000 mg | ORAL_TABLET | Freq: Two times a day (BID) | ORAL | Status: DC
  Administered 2020-05-27 – 2020-05-28 (×2): 50 mg via ORAL
  Filled 2020-05-27 (×2): qty 2

## 2020-05-27 MED ORDER — HYDROCODONE-ACETAMINOPHEN 10-325 MG PO TABS
1.0000 | ORAL_TABLET | Freq: Four times a day (QID) | ORAL | Status: DC
  Administered 2020-05-27 – 2020-05-28 (×2): 1 via ORAL
  Filled 2020-05-27 (×2): qty 1

## 2020-05-27 MED ORDER — LIDOCAINE-EPINEPHRINE 1 %-1:100000 IJ SOLN
INTRAMUSCULAR | Status: DC | PRN
  Administered 2020-05-27: 10 mL

## 2020-05-27 MED ORDER — CEFAZOLIN SODIUM-DEXTROSE 2-4 GM/100ML-% IV SOLN
2.0000 g | INTRAVENOUS | Status: AC
  Administered 2020-05-27: 2 g via INTRAVENOUS
  Filled 2020-05-27: qty 100

## 2020-05-27 MED ORDER — TRAMADOL HCL 50 MG PO TABS: 50.0000 mg | ORAL_TABLET | Freq: Three times a day (TID) | ORAL | Status: DC | PRN

## 2020-05-27 MED ORDER — ALUM & MAG HYDROXIDE-SIMETH 200-200-20 MG/5ML PO SUSP: 30.0000 mL | Freq: Four times a day (QID) | ORAL | Status: DC | PRN

## 2020-05-27 MED ORDER — ACETAMINOPHEN 10 MG/ML IV SOLN
INTRAVENOUS | Status: AC
  Administered 2020-05-27: 1000 mg via INTRAVENOUS
  Filled 2020-05-27: qty 100

## 2020-05-27 MED ORDER — HYDROMORPHONE HCL 1 MG/ML IJ SOLN
0.2500 mg | INTRAMUSCULAR | Status: DC | PRN
  Administered 2020-05-27: 0.5 mg via INTRAVENOUS

## 2020-05-27 MED ORDER — ONDANSETRON HCL 4 MG/2ML IJ SOLN
INTRAMUSCULAR | Status: DC | PRN
  Administered 2020-05-27: 4 mg via INTRAVENOUS

## 2020-05-27 MED ORDER — LEVOTHYROXINE SODIUM 25 MCG PO TABS
50.0000 ug | ORAL_TABLET | Freq: Every day | ORAL | Status: DC
  Administered 2020-05-28: 50 ug via ORAL
  Filled 2020-05-27: qty 2

## 2020-05-27 MED ORDER — PHENOL 1.4 % MT LIQD: 1.0000 | OROMUCOSAL | Status: DC | PRN

## 2020-05-27 MED ORDER — PHENYLEPHRINE 40 MCG/ML (10ML) SYRINGE FOR IV PUSH (FOR BLOOD PRESSURE SUPPORT)
PREFILLED_SYRINGE | INTRAVENOUS | Status: AC
  Filled 2020-05-27: qty 10

## 2020-05-27 MED ORDER — AMLODIPINE BESYLATE 5 MG PO TABS
5.0000 mg | ORAL_TABLET | Freq: Every day | ORAL | Status: DC
  Administered 2020-05-28: 5 mg via ORAL
  Filled 2020-05-27: qty 1

## 2020-05-27 MED ORDER — ACETAMINOPHEN 650 MG RE SUPP: 650.0000 mg | RECTAL | Status: DC | PRN

## 2020-05-27 MED ORDER — BUPIVACAINE LIPOSOME 1.3 % IJ SUSP
INTRAMUSCULAR | Status: AC
  Filled 2020-05-27: qty 20

## 2020-05-27 SURGICAL SUPPLY — 70 items
BASKET BONE COLLECTION (BASKET) ×2 IMPLANT
BENZOIN TINCTURE PRP APPL 2/3 (GAUZE/BANDAGES/DRESSINGS) ×2 IMPLANT
BLADE CLIPPER SURG (BLADE) IMPLANT
BLADE SURG 11 STRL SS (BLADE) ×2 IMPLANT
BONE VIVIGEN FORMABLE 5.4CC (Bone Implant) ×2 IMPLANT
BUR CUTTER 7.0 ROUND (BURR) ×2 IMPLANT
BUR MATCHSTICK NEURO 3.0 LAGG (BURR) ×2 IMPLANT
CANISTER SUCT 3000ML PPV (MISCELLANEOUS) ×2 IMPLANT
CAP REVERE LOCKING (Cap) ×4 IMPLANT
CARTRIDGE OIL MAESTRO DRILL (MISCELLANEOUS) ×1 IMPLANT
CATH COUDE FOLEY 2W 5CC 16FR (CATHETERS) ×2 IMPLANT
CNTNR URN SCR LID CUP LEK RST (MISCELLANEOUS) ×1 IMPLANT
CONT SPEC 4OZ STRL OR WHT (MISCELLANEOUS) ×1
COVER BACK TABLE 60X90IN (DRAPES) ×2 IMPLANT
COVER WAND RF STERILE (DRAPES) IMPLANT
DECANTER SPIKE VIAL GLASS SM (MISCELLANEOUS) ×2 IMPLANT
DERMABOND ADVANCED (GAUZE/BANDAGES/DRESSINGS) ×1
DERMABOND ADVANCED .7 DNX12 (GAUZE/BANDAGES/DRESSINGS) ×1 IMPLANT
DIFFUSER DRILL AIR PNEUMATIC (MISCELLANEOUS) ×2 IMPLANT
DRAPE C-ARM 42X72 X-RAY (DRAPES) ×4 IMPLANT
DRAPE C-ARMOR (DRAPES) IMPLANT
DRAPE HALF SHEET 40X57 (DRAPES) ×2 IMPLANT
DRAPE LAPAROTOMY 100X72X124 (DRAPES) ×2 IMPLANT
DRAPE SURG 17X23 STRL (DRAPES) ×2 IMPLANT
DRSG OPSITE 4X5.5 SM (GAUZE/BANDAGES/DRESSINGS) ×2 IMPLANT
DRSG OPSITE POSTOP 4X6 (GAUZE/BANDAGES/DRESSINGS) ×2 IMPLANT
DRSG OPSITE POSTOP 4X8 (GAUZE/BANDAGES/DRESSINGS) ×2 IMPLANT
DURAPREP 26ML APPLICATOR (WOUND CARE) ×2 IMPLANT
ELECT REM PT RETURN 9FT ADLT (ELECTROSURGICAL) ×2
ELECTRODE REM PT RTRN 9FT ADLT (ELECTROSURGICAL) ×1 IMPLANT
EVACUATOR 3/16  PVC DRAIN (DRAIN)
EVACUATOR 3/16 PVC DRAIN (DRAIN) IMPLANT
GAUZE 4X4 16PLY RFD (DISPOSABLE) IMPLANT
GAUZE SPONGE 4X4 12PLY STRL (GAUZE/BANDAGES/DRESSINGS) ×2 IMPLANT
GLOVE BIO SURGEON STRL SZ7 (GLOVE) IMPLANT
GLOVE BIO SURGEON STRL SZ8 (GLOVE) ×4 IMPLANT
GLOVE EXAM NITRILE XL STR (GLOVE) IMPLANT
GLOVE INDICATOR 8.5 STRL (GLOVE) ×4 IMPLANT
GLOVE SURG UNDER POLY LF SZ7 (GLOVE) IMPLANT
GOWN STRL REUS W/ TWL LRG LVL3 (GOWN DISPOSABLE) IMPLANT
GOWN STRL REUS W/ TWL XL LVL3 (GOWN DISPOSABLE) ×2 IMPLANT
GOWN STRL REUS W/TWL 2XL LVL3 (GOWN DISPOSABLE) IMPLANT
GOWN STRL REUS W/TWL LRG LVL3 (GOWN DISPOSABLE)
GOWN STRL REUS W/TWL XL LVL3 (GOWN DISPOSABLE) ×4
GRAFT BONE PROTEIOS LRG 5CC (Orthopedic Implant) ×2 IMPLANT
HEMOSTAT POWDER KIT SURGIFOAM (HEMOSTASIS) ×2 IMPLANT
KIT BASIN OR (CUSTOM PROCEDURE TRAY) ×2 IMPLANT
KIT TURNOVER KIT B (KITS) ×2 IMPLANT
MILL MEDIUM DISP (BLADE) ×2 IMPLANT
NEEDLE HYPO 25X1 1.5 SAFETY (NEEDLE) ×2 IMPLANT
NS IRRIG 1000ML POUR BTL (IV SOLUTION) ×2 IMPLANT
OIL CARTRIDGE MAESTRO DRILL (MISCELLANEOUS) ×2
PACK LAMINECTOMY NEURO (CUSTOM PROCEDURE TRAY) ×2 IMPLANT
PAD ARMBOARD 7.5X6 YLW CONV (MISCELLANEOUS) ×10 IMPLANT
ROD REVERSE 6.35 STRAIGHT 75MM (Rod) ×2 IMPLANT
ROD STRAIGHT REVERE 6.35 65MM (Rod) ×2 IMPLANT
SCREW REVERE 6.35 6.5MMX45 (Screw) ×4 IMPLANT
SCREW SET BREAK OFF (Screw) ×8 IMPLANT
SPACER SUSTAIN 10X26X13 8D RT (Spacer) ×4 IMPLANT
SPONGE LAP 4X18 RFD (DISPOSABLE) IMPLANT
SPONGE SURGIFOAM ABS GEL 100 (HEMOSTASIS) ×2 IMPLANT
STRIP CLOSURE SKIN 1/2X4 (GAUZE/BANDAGES/DRESSINGS) ×2 IMPLANT
SUT VIC AB 0 CT1 18XCR BRD8 (SUTURE) ×1 IMPLANT
SUT VIC AB 0 CT1 8-18 (SUTURE) ×1
SUT VIC AB 2-0 CT1 18 (SUTURE) ×2 IMPLANT
SUT VIC AB 4-0 PS2 27 (SUTURE) ×2 IMPLANT
TOWEL GREEN STERILE (TOWEL DISPOSABLE) ×2 IMPLANT
TOWEL GREEN STERILE FF (TOWEL DISPOSABLE) ×2 IMPLANT
TRAY FOLEY MTR SLVR 16FR STAT (SET/KITS/TRAYS/PACK) ×2 IMPLANT
WATER STERILE IRR 1000ML POUR (IV SOLUTION) ×2 IMPLANT

## 2020-05-27 NOTE — Op Note (Signed)
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  Posterior lumbar interbody fusion utilizing the globus insert and rotate titanium cages packed with locally harvested autograft mixed with vivigen and protios  3.  Pedicle screw fixation L4-5 with utilizing the old Medtronic M ten 6.35 pedicle screws left over from L5 and S1 marrying up to globus Revere 6.35 pedicle screws at L4  Surgeon: Jillyn Hidden Icess Bertoni  Assistant: Julien Girt  Anesthesia: General  EBL: Minimal  HPI: 65 year old gentleman about 18 years ago underwent an L5-S1 fusion did very well however does have a gross worsening back bilateral hip and leg pain work-up revealed progressive degeneration deterioration at L4-5 with spinal stenosis at that level.  Due to patient's progression of clinical syndrome imaging findings and failed conservative treatment I recommended decompressive laminectomy interbody fusion at L4-5 with exploration fusion removal hardware L5-S1 I extensively went over the risks and benefits of that operation with him as well as perioperative course expectations of outcome and alternatives of surgery and he understood and agreed to proceed forward.  Operative procedure: Patient was brought into the OR was [facility] general anesthesia positioned prone Wilson frame his back was prepped and draped in routine sterile fashion his old incision was opened up and extended slightly cephalad subperiosteal dissection was carried out lamina of L4 exposing the TPs at L4 and exposing the old hardware at L5-S1.  The old rods and knots were disconnected and removed I attempted to take out the S1 screws however I  was unable to do so so I left behind.  I then remove the spinous process drilled down the facet joints performed a complete facetectomy bilaterally with complete central decompression and then go aggressively under bit the supra articulating facet and performed radical foraminotomies of the L4 and dissected through the scar tissue with redo foraminotomies of L5.  After I gained access lateral margin the disc base disc base was incised cleaned out bilaterally and endplates were prepared bilaterally and extensive degenerative disc material was removed both centrally and laterally then attention taken the interbody work insert sized and selected a 10 mm wide 13 mm tall 8 degree cage packed with locally harvested autograft mixed and inserted then I packed an extensive mount of autograft mix centrally and anterior to the cage and inserted the contralateral cage.  Then I placed 2 pedicle screws at L4 under fluoroscopy.  Both screws had excellent purchase.  I then tied everything up with the previous screws at 5 and S1 anchored everything in place and compressed L4 against L5.  I then reinspected the foramina to confirm patency no migration of graft material the Gelfoam of the dura a medium Hemovac drain injected Exparel in the fascia.  I then closed the wound in layers with Vicryl and a running 4 subcuticular.  Dermabond benzoin Steri-Strips and a sterile dressing was applied and patient went recovery room in stable condition.  At the end the case all needle counts and sponge counts were correct.

## 2020-05-27 NOTE — Transfer of Care (Signed)
Immediate Anesthesia Transfer of Care Note  Patient: Chris Dodson  Procedure(s) Performed: Posterior Lumbar Interbody Fusion - Lumbar Four- Lumbar Five (N/A Back)  Patient Location: PACU  Anesthesia Type:General  Level of Consciousness: awake, alert  and oriented  Airway & Oxygen Therapy: Patient Spontanous Breathing and Patient connected to face mask oxygen  Post-op Assessment: Report given to RN and Post -op Vital signs reviewed and stable  Post vital signs: Reviewed and stable  Last Vitals:  Vitals Value Taken Time  BP 161/86 05/27/20 1753  Temp    Pulse 83 05/27/20 1755  Resp 18 05/27/20 1755  SpO2 96 % 05/27/20 1755  Vitals shown include unvalidated device data.  Last Pain:  Vitals:   05/27/20 1333  TempSrc:   PainSc: 5       Patients Stated Pain Goal: 3 (05/27/20 1333)  Complications: No complications documented.

## 2020-05-27 NOTE — Anesthesia Preprocedure Evaluation (Signed)
Anesthesia Evaluation  Patient identified by MRN, date of birth, ID band Patient awake    Reviewed: Allergy & Precautions, NPO status , Patient's Chart, lab work & pertinent test results  Airway Mallampati: II  TM Distance: >3 FB Neck ROM: Full    Dental no notable dental hx.    Pulmonary neg pulmonary ROS,    Pulmonary exam normal breath sounds clear to auscultation       Cardiovascular hypertension, Normal cardiovascular exam Rhythm:Regular Rate:Normal     Neuro/Psych negative neurological ROS  negative psych ROS   GI/Hepatic Neg liver ROS, GERD  ,  Endo/Other  negative endocrine ROS  Renal/GU negative Renal ROS  negative genitourinary   Musculoskeletal negative musculoskeletal ROS (+)   Abdominal   Peds negative pediatric ROS (+)  Hematology negative hematology ROS (+)   Anesthesia Other Findings   Reproductive/Obstetrics negative OB ROS                             Anesthesia Physical Anesthesia Plan  ASA: II  Anesthesia Plan: General   Post-op Pain Management:    Induction: Intravenous  PONV Risk Score and Plan: 2 and Ondansetron and Dexamethasone  Airway Management Planned: Oral ETT  Additional Equipment:   Intra-op Plan:   Post-operative Plan: Extubation in OR  Informed Consent: I have reviewed the patients History and Physical, chart, labs and discussed the procedure including the risks, benefits and alternatives for the proposed anesthesia with the patient or authorized representative who has indicated his/her understanding and acceptance.   Dental advisory given  Plan Discussed with: CRNA and Surgeon  Anesthesia Plan Comments:         Anesthesia Quick Evaluation  

## 2020-05-27 NOTE — H&P (Signed)
Chris Dodson is an 65 y.o. male.   Chief Complaint: Back pain HPI: 65 year old gentleman longstanding issues with back previously undergone L5-S1 fusion many years ago presents now progressive worsening back bilateral hip and leg pain work-up revealed segmental degeneration above his previous fusion.  So due to his failed conservative treatment imaging findings and progression of clinical syndrome I recommended decompression and interbody fusion at L4-5 with an exploration fusion move of hardware L5-S1.  I extensively reviewed the risks and benefits of the operation with the patient as well as perioperative course expectations of outcome and alternatives of surgery and he understood and agreed to proceed forward.  Past Medical History:  Diagnosis Date  . GERD (gastroesophageal reflux disease)   . Hypertension   . Sleep apnea     Past Surgical History:  Procedure Laterality Date  . BACK SURGERY     Dr. Wynetta Emery    No family history on file. Social History:  reports that he has never smoked. He has never used smokeless tobacco. He reports previous alcohol use. He reports that he does not use drugs.  Allergies: No Known Allergies  Medications Prior to Admission  Medication Sig Dispense Refill  . amLODipine (NORVASC) 5 MG tablet Take 5 mg by mouth daily.    Marland Kitchen atorvastatin (LIPITOR) 20 MG tablet Take 20 mg by mouth daily.    . celecoxib (CELEBREX) 100 MG capsule Take 100 mg by mouth 2 (two) times daily.    . fenofibrate 160 MG tablet Take 160 mg by mouth daily.    Marland Kitchen FLUoxetine (PROZAC) 20 MG capsule Take 40 mg by mouth daily.    Marland Kitchen gabapentin (NEURONTIN) 300 MG capsule Take 300 mg by mouth 3 (three) times daily.    Marland Kitchen HYDROcodone-acetaminophen (NORCO) 10-325 MG tablet Take 1 tablet by mouth every 6 (six) hours.    Marland Kitchen losartan (COZAAR) 50 MG tablet Take 50 mg by mouth daily.    . metFORMIN (GLUCOPHAGE) 500 MG tablet Take 500 mg by mouth 2 (two) times daily.    . metoprolol tartrate  (LOPRESSOR) 100 MG tablet Take 50 mg by mouth in the morning and at bedtime.    . Multiple Vitamin (MULTIVITAMIN WITH MINERALS) TABS tablet Take 1 tablet by mouth daily.    . pantoprazole (PROTONIX) 40 MG tablet Take 40 mg by mouth daily.    Marland Kitchen SYNTHROID 50 MCG tablet Take 50 mcg by mouth daily before breakfast.    . traMADol (ULTRAM) 50 MG tablet Take 50 mg by mouth 3 (three) times daily as needed for pain.    . traZODone (DESYREL) 50 MG tablet Take 50 mg by mouth at bedtime.      Results for orders placed or performed during the hospital encounter of 05/27/20 (from the past 48 hour(s))  Glucose, capillary     Status: Abnormal   Collection Time: 05/27/20 12:57 PM  Result Value Ref Range   Glucose-Capillary 113 (H) 70 - 99 mg/dL    Comment: Glucose reference range applies only to samples taken after fasting for at least 8 hours.   Comment 1 Notify RN    Comment 2 Document in Chart   CBC     Status: None   Collection Time: 05/27/20  1:04 PM  Result Value Ref Range   WBC 7.2 4.0 - 10.5 K/uL   RBC 4.63 4.22 - 5.81 MIL/uL   Hemoglobin 14.5 13.0 - 17.0 g/dL   HCT 77.9 39.0 - 30.0 %   MCV 87.7  80.0 - 100.0 fL   MCH 31.3 26.0 - 34.0 pg   MCHC 35.7 30.0 - 36.0 g/dL   RDW 16.9 67.8 - 93.8 %   Platelets 187 150 - 400 K/uL   nRBC 0.0 0.0 - 0.2 %    Comment: Performed at [facility], [address], [place], [place] [address]  ABO/Rh     Status: None   Collection Time: 05/27/20  1:37 PM  Result Value Ref Range   ABO/RH(D)      A POS Performed at [facility], [address] [place], [place] [address]    No results found.  Review of Systems  Musculoskeletal: Positive for back pain.  Neurological: Positive for numbness.    Blood pressure (!) 130/97, pulse 60, temperature (!) 97.5 F (36.4 C), temperature source Oral, resp. rate 20, height 5\' 7"  (1.702 m), weight 104.8 kg, SpO2 97 %. Physical Exam HENT:     Head: Normocephalic.     Right Ear: Tympanic membrane normal.      Nose: Nose normal.     Mouth/Throat:     Mouth: Mucous membranes are moist.  Eyes:     Pupils: Pupils are equal, round, and reactive to light.  Cardiovascular:     Rate and Rhythm: Normal rate.  Pulmonary:     Effort: Pulmonary effort is normal.  Abdominal:     General: Abdomen is flat.  Musculoskeletal:        General: Normal range of motion.  Skin:    General: Skin is warm.  Neurological:     General: No focal deficit present.     Mental Status: He is alert.     Comments: out of 5 iliopsoas, quads, hamstrings, anterior tibialis and EHL.      Assessment/Plan 65 year old presents for decompression fusion L4-5 removal of hardware L5-S1.  78, MD 05/27/2020, 2:24 PM

## 2020-05-27 NOTE — Anesthesia Procedure Notes (Signed)
Procedure Name: Intubation Date/Time: 05/27/2020 2:54 PM Performed by: Inda Coke, CRNA Pre-anesthesia Checklist: Patient identified, Emergency Drugs available, Suction available and Patient being monitored Patient Re-evaluated:Patient Re-evaluated prior to induction Oxygen Delivery Method: Circle System Utilized Preoxygenation: Pre-oxygenation with 100% oxygen Induction Type: IV induction Ventilation: Oral airway inserted - appropriate to patient size, Mask ventilation with difficulty and Two handed mask ventilation required Laryngoscope Size: Mac and 4 Grade View: Grade I Tube type: Oral Tube size: 7.5 mm Number of attempts: 1 Airway Equipment and Method: Stylet and Oral airway Placement Confirmation: ETT inserted through vocal cords under direct vision,  positive ETCO2 and breath sounds checked- equal and bilateral Secured at: 22 cm Tube secured with: Tape Dental Injury: Teeth and Oropharynx as per pre-operative assessment

## 2020-05-28 DIAGNOSIS — M5116 Intervertebral disc disorders with radiculopathy, lumbar region: Secondary | ICD-10-CM | POA: Diagnosis not present

## 2020-05-28 MED ORDER — OXYCODONE HCL 10 MG PO TABS: 10.0000 mg | ORAL_TABLET | ORAL | 0 refills | Status: AC | PRN

## 2020-05-28 MED ORDER — CYCLOBENZAPRINE HCL 10 MG PO TABS: 10.0000 mg | ORAL_TABLET | Freq: Three times a day (TID) | ORAL | 0 refills | Status: AC | PRN

## 2020-05-28 NOTE — Evaluation (Signed)
Occupational Therapy Evaluation Patient Details Name: Chris Dodson MRN: [id number] DOB: [birth year] Today's Date: 05/28/2020    History of Present Illness 65 y.o. male who was admitted for decompressive lumbar laminectomy L4-5 with complete medial facetectomies and radical foraminotomies.  PMH includes:GERD, Hypertension, Sleep apnea.   Clinical Impression   Patient admitted for the diagnosis and procedure above.  PTA he lived with his wife who works, but will be able to assist as needed.  Mild verbal cueing needed to prevent forward flexion at the trunk for dressing, but he is close to his baseline, and no post acute OT needs identified.  All questions answered.        Follow Up Recommendations  No OT follow up    Equipment Recommendations  None recommended by OT    Recommendations for Other Services       Precautions / Restrictions Precautions Precautions: Back Precaution Booklet Issued: Yes (comment) Precaution Comments: Reviewed Required Braces or Orthoses: Spinal Brace Spinal Brace: Lumbar corset Restrictions Weight Bearing Restrictions: No      Mobility Bed Mobility Overal bed mobility: Modified Independent               Patient Response: Cooperative  Transfers Overall transfer level: Modified independent                    Balance Overall balance assessment: Mild deficits observed, not formally tested                                         ADL either performed or assessed with clinical judgement   ADL Overall ADL's : Modified independent                                       General ADL Comments: Able to complete ADL from a modified sit/stand leve.  VC's to not reach for objects on the floor.     Vision Patient Visual Report: No change from baseline       Perception     Praxis      Pertinent Vitals/Pain Pain Assessment: 0-10 Pain Score: 2  Pain Location: low back Pain Descriptors / Indicators:  Operative site guarding Pain Intervention(s): Monitored during session     Hand Dominance Right   Extremity/Trunk Assessment Upper Extremity Assessment Upper Extremity Assessment: Overall WFL for tasks assessed   Lower Extremity Assessment Lower Extremity Assessment: Defer to PT evaluation   Cervical / Trunk Assessment Cervical / Trunk Assessment: Other exceptions Cervical / Trunk Exceptions: spine surgery   Communication Communication Communication: No difficulties   Cognition Arousal/Alertness: Awake/alert Behavior During Therapy: WFL for tasks assessed/performed Overall Cognitive Status: Within Functional Limits for tasks assessed                                                      Home Living Family/patient expects to be discharged to:: Private residence Living Arrangements: Spouse/significant other Available Help at Discharge: Family;Available 24 hours/day Type of Home: House       Home Layout: One level     Bathroom Shower/Tub: Chief Strategy Officer: Standard     Home  Equipment: None          Prior Functioning/Environment Level of Independence: Independent                 OT Problem List: Impaired balance (sitting and/or standing)      OT Treatment/Interventions:      OT Goals(Current goals can be found in the care plan section) Acute Rehab OT Goals Patient Stated Goal: Return home and recover OT Goal Formulation: With patient Time For Goal Achievement: 05/28/20 Potential to Achieve Goals: Good  OT Frequency:     Barriers to D/C:  none noted          Co-evaluation              AM-PAC OT "6 Clicks" Daily Activity     Outcome Measure Help from another person eating meals?: None Help from another person taking care of personal grooming?: None Help from another person toileting, which includes using toliet, bedpan, or urinal?: None Help from another person bathing (including washing, rinsing,  drying)?: None Help from another person to put on and taking off regular upper body clothing?: None Help from another person to put on and taking off regular lower body clothing?: None 6 Click Score: 24   End of Session Equipment Utilized During Treatment: Back brace Nurse Communication: Mobility status  Activity Tolerance: Patient tolerated treatment well Patient left: in bed;with call bell/phone within reach  OT Visit Diagnosis: Unsteadiness on feet (R26.81)                Time: 1610-9604 OT Time Calculation (min): 20 min Charges:  OT General Charges $OT Visit: 1 Visit OT Evaluation $OT Eval Moderate Complexity: 1 Mod  05/28/2020  Rich, OTR/L  Acute Rehabilitation Services  Office:  [phone]   Suzanna Obey 05/28/2020, 9:20 AM

## 2020-05-28 NOTE — Discharge Summary (Signed)
Physician Discharge Summary  Patient ID: Chris Dodson MRN: [id number] DOB/AGE: November 27, 1955 65 y.o.  Admit date: 05/27/2020 Discharge date: 05/28/2020  Admission Diagnoses:  Spinal stenosis, L4-5  Discharge Diagnoses:  Same Active Problems:   Spinal stenosis at L4-L5 level   Discharged Condition: Stable  Hospital Course:  Chris Dodson is a 65 y.o. male who was admitted for the below procedure. There were no post operative complications. At time of discharge, pain was well controlled, ambulating with Pt/OT, tolerating po, voiding normal. Ready for discharge.  Treatments: Surgery  #1 decompressive lumbar laminectomy L4-5 with complete medial facetectomies and radical foraminotomies in excess and requiring more work than would be needed with a standard interbody fusion with foraminotomies of L4 and redo foraminotomies of L5 2.  Posterior lumbar interbody fusion utilizing the globus insert and rotate titanium cages packed with locally harvested autograft mixed with vivigen and protios 3.  Pedicle screw fixation L4-5 with utilizing the old Medtronic M ten 6.35 pedicle screws left over from L5 and S1 marrying up to globus Revere 6.35 pedicle screws at L4  Discharge Exam: Blood pressure 137/70, pulse 66, temperature 98 F (36.7 C), temperature source Oral, resp. rate 20, height 5\' 7"  (1.702 m), weight 104.8 kg, SpO2 94 %. Awake, alert, oriented Speech fluent, appropriate CN grossly intact 5/5 BUE/BLE Wound c/d/i  Disposition: Discharge disposition: 01-Home or Self Care       Discharge Instructions    Call MD for:  difficulty breathing, headache or visual disturbances   Complete by: As directed    Call MD for:  persistant dizziness or light-headedness   Complete by: As directed    Call MD for:  redness, tenderness, or signs of infection (pain, swelling, redness, odor or green/yellow discharge around incision site)   Complete by: As directed    Call MD for:  severe  uncontrolled pain   Complete by: As directed    Call MD for:  temperature >100.4   Complete by: As directed    Diet - low sodium heart healthy   Complete by: As directed    Driving Restrictions   Complete by: As directed    Do not drive until given clearance.   Increase activity slowly   Complete by: As directed    Lifting restrictions   Complete by: As directed    Do not lift anything >10lbs. Avoid bending and twisting in awkward positions. Avoid bending at the back.   May shower / Bathe   Complete by: As directed    In 24 hours. Okay to wash wound with warm soapy water. Avoid scrubbing the wound. Pat dry.   Remove dressing in 48 hours   Complete by: As directed      Allergies as of 05/28/2020   No Known Allergies     Medication List    STOP taking these medications   HYDROcodone-acetaminophen 10-325 MG tablet Commonly known as: NORCO   traMADol 50 MG tablet Commonly known as: ULTRAM     TAKE these medications   amLODipine 5 MG tablet Commonly known as: NORVASC Take 5 mg by mouth daily.   atorvastatin 20 MG tablet Commonly known as: LIPITOR Take 20 mg by mouth daily.   celecoxib 100 MG capsule Commonly known as: CELEBREX Take 100 mg by mouth 2 (two) times daily.   cyclobenzaprine 10 MG tablet Commonly known as: FLEXERIL Take 1 tablet (10 mg total) by mouth 3 (three) times daily as needed for muscle spasms.   fenofibrate  160 MG tablet Take 160 mg by mouth daily.   FLUoxetine 20 MG capsule Commonly known as: PROZAC Take 40 mg by mouth daily.   gabapentin 300 MG capsule Commonly known as: NEURONTIN Take 300 mg by mouth 3 (three) times daily.   losartan 50 MG tablet Commonly known as: COZAAR Take 50 mg by mouth daily.   metFORMIN 500 MG tablet Commonly known as: GLUCOPHAGE Take 500 mg by mouth 2 (two) times daily.   metoprolol tartrate 100 MG tablet Commonly known as: LOPRESSOR Take 50 mg by mouth in the morning and at bedtime.   multivitamin  with minerals Tabs tablet Take 1 tablet by mouth daily.   Oxycodone HCl 10 MG Tabs Take 1 tablet (10 mg total) by mouth every 4 (four) hours as needed for severe pain.   pantoprazole 40 MG tablet Commonly known as: PROTONIX Take 40 mg by mouth daily.   Synthroid 50 MCG tablet Generic drug: levothyroxine Take 50 mcg by mouth daily before breakfast.   traZODone 50 MG tablet Commonly known as: DESYREL Take 50 mg by mouth at bedtime.       Follow-up Information    Donalee Citrin, MD Follow up.   Specialty: Neurosurgery Contact information: [address] [place] [address] [phone]               Signed: Alyson Dodson 05/28/2020, 7:16 AM

## 2020-05-28 NOTE — Evaluation (Signed)
Physical Therapy Evaluation & Discharge Patient Details Name: Chris Dodson MRN: [id number] DOB: [birth year] Today's Date: 05/28/2020   History of Present Illness  65 y.o. male who was admitted for decompressive lumbar laminectomy L4-5 with complete medial facetectomies and radical foraminotomies.  PMH includes:GERD, Hypertension, Sleep apnea.  Clinical Impression  PTA, patient lives with wife and independent. Patient overall functioning at modI level with no AD. Educated patient on back precautions and provided handout. Patient required cues for maintaining back precautions during ambulation. No further skilled PT needs required during acute stay. No PT follow up recommended at this time. PT will sign off.    Follow Up Recommendations No PT follow up    Equipment Recommendations  None recommended by PT    Recommendations for Other Services       Precautions / Restrictions Precautions Precautions: Back Precaution Booklet Issued: Yes (comment) Precaution Comments: Reviewed Required Braces or Orthoses: Spinal Brace Spinal Brace: Lumbar corset Restrictions Weight Bearing Restrictions: No      Mobility  Bed Mobility Overal bed mobility: Modified Independent                  Transfers Overall transfer level: Modified independent                  Ambulation/Gait Ambulation/Gait assistance: Modified independent (Device/Increase time) Gait Distance (Feet): 300 Feet Assistive device: None Gait Pattern/deviations: WFL(Within Functional Limits);Decreased stride length   Gait velocity interpretation: >2.62 ft/sec, indicative of community ambulatory    Stairs            Wheelchair Mobility    Modified Rankin (Stroke Patients Only)       Balance Overall balance assessment: Mild deficits observed, not formally tested                                           Pertinent Vitals/Pain Pain Assessment: 0-10 Pain Score: 2  Pain  Location: low back Pain Descriptors / Indicators: Operative site guarding Pain Intervention(s): Monitored during session    Home Living Family/patient expects to be discharged to:: Private residence Living Arrangements: Spouse/significant other Available Help at Discharge: Family;Available 24 hours/day Type of Home: House Home Access: Level entry     Home Layout: One level Home Equipment: None      Prior Function Level of Independence: Independent               Hand Dominance   Dominant Hand: Right    Extremity/Trunk Assessment   Upper Extremity Assessment Upper Extremity Assessment: Overall WFL for tasks assessed    Lower Extremity Assessment Lower Extremity Assessment: Overall WFL for tasks assessed    Cervical / Trunk Assessment Cervical / Trunk Assessment: Other exceptions Cervical / Trunk Exceptions: spine surgery  Communication   Communication: No difficulties  Cognition Arousal/Alertness: Awake/alert Behavior During Therapy: WFL for tasks assessed/performed Overall Cognitive Status: Within Functional Limits for tasks assessed                                        General Comments      Exercises     Assessment/Plan    PT Assessment Patent does not need any further PT services  PT Problem List         PT Treatment Interventions  PT Goals (Current goals can be found in the Care Plan section)  Acute Rehab PT Goals Patient Stated Goal: Return home and recover PT Goal Formulation: With patient    Frequency     Barriers to discharge        Co-evaluation               AM-PAC PT "6 Clicks" Mobility  Outcome Measure Help needed turning from your back to your side while in a flat bed without using bedrails?: None Help needed moving from lying on your back to sitting on the side of a flat bed without using bedrails?: None Help needed moving to and from a bed to a chair (including a wheelchair)?: None Help needed  standing up from a chair using your arms (e.g., wheelchair or bedside chair)?: None Help needed to walk in hospital room?: None Help needed climbing 3-5 steps with a railing? : None 6 Click Score: 24    End of Session Equipment Utilized During Treatment: Back brace Activity Tolerance: Patient tolerated treatment well Patient left: in bed;with call bell/phone within reach Nurse Communication: Mobility status PT Visit Diagnosis: Muscle weakness (generalized) (M62.81)    Time: 2122-4825 PT Time Calculation (min) (ACUTE ONLY): 18 min   Charges:   PT Evaluation $PT Eval Low Complexity: 1 Low          Amante Fomby A. Dan Humphreys PT, DPT Acute Rehabilitation Services Pager [phone] Office [phone]   Viviann Spare 05/28/2020, 9:29 AM

## 2020-05-28 NOTE — Progress Notes (Signed)
Patient alert and oriented, voiding adequately, skin clean, dry and intact without evidence of skin break down, or symptoms of complications - no redness or edema noted, only slight tenderness at site.  Patient states pain is manageable at time of discharge. Patient has an appointment with MD in 2 weeks 

## 2020-05-28 NOTE — Anesthesia Postprocedure Evaluation (Signed)
Anesthesia Post Note  Patient: Chris Dodson  Procedure(s) Performed: Posterior Lumbar Interbody Fusion - Lumbar Four- Lumbar Five (N/A Back)     Patient location during evaluation: PACU Anesthesia Type: General Level of consciousness: awake Pain management: pain level controlled Vital Signs Assessment: post-procedure vital signs reviewed and stable Respiratory status: spontaneous breathing, nonlabored ventilation, respiratory function stable and patient connected to nasal cannula oxygen Cardiovascular status: blood pressure returned to baseline and stable Postop Assessment: no apparent nausea or vomiting Anesthetic complications: no   No complications documented.  Last Vitals:  Vitals:   05/27/20 2311 05/28/20 0331  BP: 125/60 137/70  Pulse: 90 66  Resp: 20 20  Temp: 36.7 C 36.7 C  SpO2: 93% 94%    Last Pain:  Vitals:   05/28/20 0518  TempSrc:   PainSc: 7                  Chris Dodson

## 2020-06-10 ENCOUNTER — Encounter (HOSPITAL_COMMUNITY): Payer: Self-pay | Admitting: Neurosurgery

## 2020-08-19 ENCOUNTER — Other Ambulatory Visit: Payer: Self-pay | Admitting: Neurosurgery

## 2020-08-19 ENCOUNTER — Other Ambulatory Visit (HOSPITAL_COMMUNITY): Payer: Self-pay | Admitting: Neurosurgery

## 2020-08-19 DIAGNOSIS — M542 Cervicalgia: Secondary | ICD-10-CM

## 2020-08-19 DIAGNOSIS — M546 Pain in thoracic spine: Secondary | ICD-10-CM

## 2020-08-27 ENCOUNTER — Ambulatory Visit (HOSPITAL_BASED_OUTPATIENT_CLINIC_OR_DEPARTMENT_OTHER): Payer: Medicare (Managed Care)

## 2020-08-27 ENCOUNTER — Encounter (HOSPITAL_BASED_OUTPATIENT_CLINIC_OR_DEPARTMENT_OTHER): Payer: Self-pay

## 2020-09-10 ENCOUNTER — Ambulatory Visit (HOSPITAL_BASED_OUTPATIENT_CLINIC_OR_DEPARTMENT_OTHER): Payer: Medicare (Managed Care)

## 2020-09-24 ENCOUNTER — Other Ambulatory Visit: Payer: Self-pay

## 2020-09-24 ENCOUNTER — Ambulatory Visit (HOSPITAL_BASED_OUTPATIENT_CLINIC_OR_DEPARTMENT_OTHER)
Admission: RE | Admit: 2020-09-24 | Discharge: 2020-09-24 | Disposition: A | Discharge status: Home / Self Care | Payer: Medicare (Managed Care) | Source: Ambulatory Visit | Attending: Neurosurgery | Admitting: Neurosurgery

## 2020-09-24 ENCOUNTER — Encounter (HOSPITAL_BASED_OUTPATIENT_CLINIC_OR_DEPARTMENT_OTHER): Payer: Self-pay

## 2020-09-24 DIAGNOSIS — M546 Pain in thoracic spine: Secondary | ICD-10-CM

## 2021-10-14 IMAGING — RF DG LUMBAR SPINE 2-3V
1 series · 2 of 2 positions shown · non-contrast
Comparison: 02/10/2020.

CLINICAL DATA: L4-L5 interbody fusion.

EXAM:
DG C-ARM 1-60 MIN; LUMBAR SPINE - 2-3 VIEW
FLUOROSCOPY TIME:  Fluoroscopy Time:  29 seconds
Radiation Exposure Index (if provided by the fluoroscopic device):
26.75 mGy
Number of Acquired Spot Images: 2

[Series 1: run · 2 of 2 slices shown]
[im 1/2]
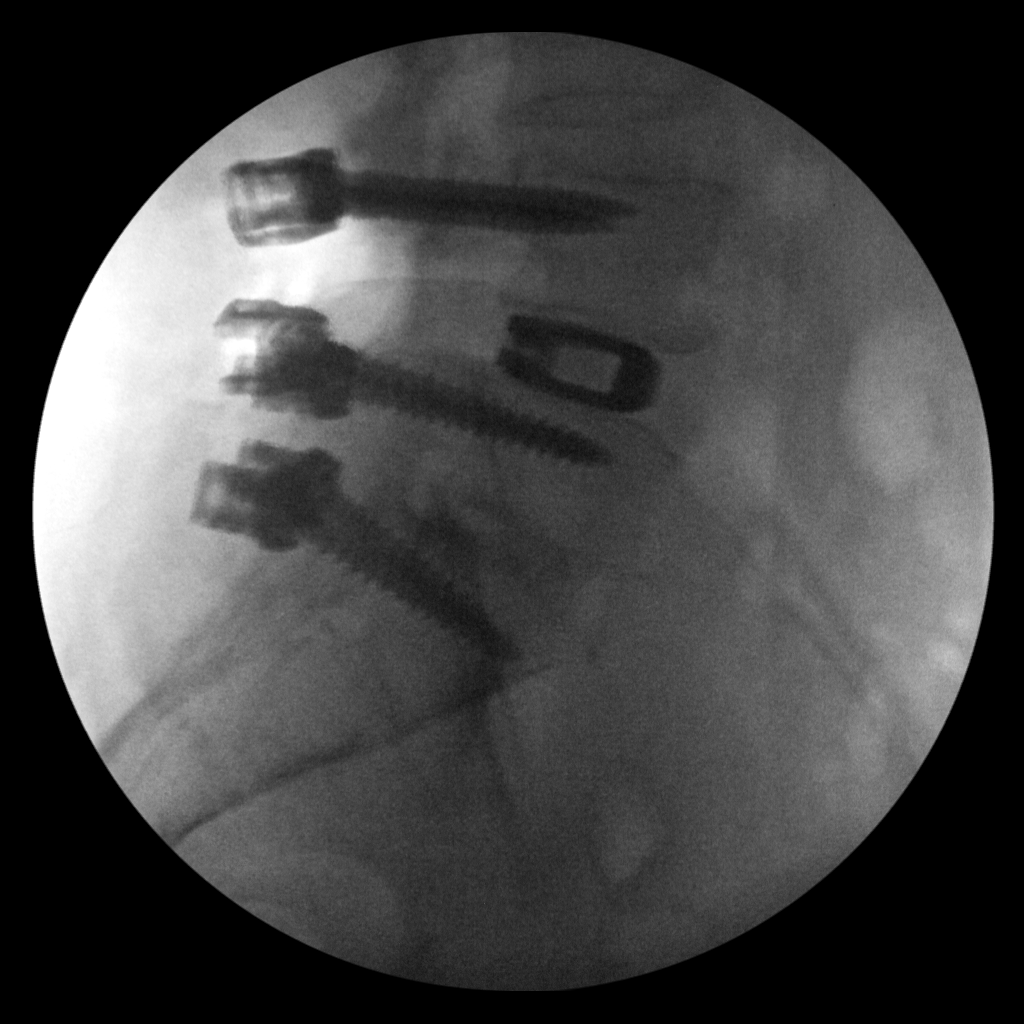
[im 2/2]
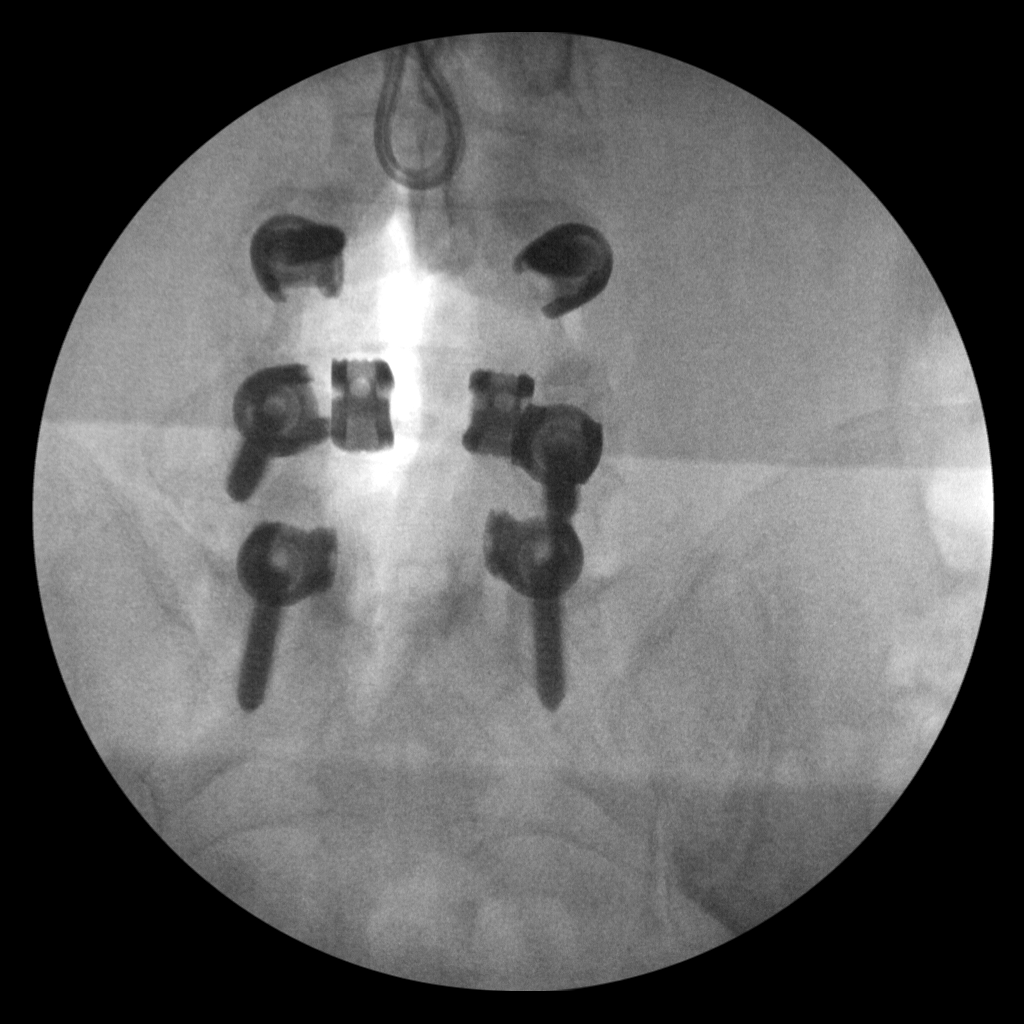

[2 of 2 positions shown; findings below may reference images not displayed]

FINDINGS: Two C-arm fluoroscopic images were obtained intraoperatively and
submitted for post operative interpretation. These images
demonstrate bilateral posterior screws at L4, L5, and S1. There is
an interbody spacer at L4-L5. Leads from a thoracic spinal cord
stimulator are partially imaged. No unexpected findings. Please see
the performing provider's procedural report for further detail.
IMPRESSION: Intraoperative fluoroscopy, as detailed above.
